# Patient Record
Sex: Male | Born: 1965 | Race: Black or African American | Hispanic: No | Marital: Married | Smoking: Never smoker
Health system: Southern US, Community
[De-identification: ages and names within clinical notes are randomized; demographics above are authoritative.]

## PROBLEM LIST (undated history)

## (undated) DIAGNOSIS — E669 Obesity, unspecified: Secondary | ICD-10-CM

## (undated) DIAGNOSIS — F419 Anxiety disorder, unspecified: Secondary | ICD-10-CM

## (undated) DIAGNOSIS — M545 Low back pain, unspecified: Secondary | ICD-10-CM

## (undated) DIAGNOSIS — R519 Headache, unspecified: Secondary | ICD-10-CM

## (undated) DIAGNOSIS — F4312 Post-traumatic stress disorder, chronic: Secondary | ICD-10-CM

## (undated) DIAGNOSIS — H9319 Tinnitus, unspecified ear: Secondary | ICD-10-CM

## (undated) DIAGNOSIS — J3089 Other allergic rhinitis: Secondary | ICD-10-CM

## (undated) DIAGNOSIS — M1992 Post-traumatic osteoarthritis, unspecified site: Secondary | ICD-10-CM

## (undated) DIAGNOSIS — N529 Male erectile dysfunction, unspecified: Secondary | ICD-10-CM

## (undated) HISTORY — DX: Other allergic rhinitis: J30.89

## (undated) HISTORY — DX: Headache, unspecified: R51.9

## (undated) HISTORY — DX: Obesity, unspecified: E66.9

## (undated) HISTORY — DX: Post-traumatic stress disorder, chronic: F43.12

## (undated) HISTORY — DX: Low back pain, unspecified: M54.50

## (undated) HISTORY — DX: Anxiety disorder, unspecified: F41.9

## (undated) HISTORY — DX: Male erectile dysfunction, unspecified: N52.9

## (undated) HISTORY — DX: Tinnitus, unspecified ear: H93.19

## (undated) HISTORY — PX: REVISION OF SCAR ON FACE/HEAD: SHX2349

## (undated) HISTORY — DX: Post-traumatic osteoarthritis, unspecified site: M19.92

---
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
## 2017-10-29 ENCOUNTER — Other Ambulatory Visit: Payer: Self-pay | Admitting: Otolaryngology

## 2017-10-29 DIAGNOSIS — H918X1 Other specified hearing loss, right ear: Secondary | ICD-10-CM

## 2017-10-29 DIAGNOSIS — IMO0001 Reserved for inherently not codable concepts without codable children: Secondary | ICD-10-CM

## 2017-11-11 ENCOUNTER — Other Ambulatory Visit: Payer: Self-pay

## 2017-11-28 ENCOUNTER — Ambulatory Visit
Admission: RE | Admit: 2017-11-28 | Discharge: 2017-11-28 | Disposition: A | Discharge status: Home / Self Care | Payer: 59 | Source: Ambulatory Visit | Attending: Otolaryngology | Admitting: Otolaryngology

## 2017-11-28 ENCOUNTER — Other Ambulatory Visit: Payer: Self-pay | Admitting: Otolaryngology

## 2017-11-28 DIAGNOSIS — IMO0001 Reserved for inherently not codable concepts without codable children: Secondary | ICD-10-CM

## 2017-11-28 DIAGNOSIS — H918X1 Other specified hearing loss, right ear: Secondary | ICD-10-CM
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
## 2020-01-24 ENCOUNTER — Ambulatory Visit (INDEPENDENT_AMBULATORY_CARE_PROVIDER_SITE_OTHER): Payer: No Typology Code available for payment source | Admitting: Neurology

## 2020-01-24 ENCOUNTER — Other Ambulatory Visit: Payer: Self-pay

## 2020-01-24 ENCOUNTER — Encounter: Payer: Self-pay | Admitting: Neurology

## 2020-01-24 ENCOUNTER — Encounter: Payer: Self-pay | Admitting: *Deleted

## 2020-01-24 VITALS — BP 162/77 | HR 52 | Ht 71.0 in | Wt 210.0 lb

## 2020-01-24 DIAGNOSIS — G43009 Migraine without aura, not intractable, without status migrainosus: Secondary | ICD-10-CM

## 2020-01-24 MED ORDER — RIZATRIPTAN BENZOATE 10 MG PO TBDP: 10.0000 mg | ORAL_TABLET | ORAL | 11 refills | Status: AC | PRN

## 2020-01-24 MED ORDER — ONDANSETRON 4 MG PO TBDP: 4.0000 mg | ORAL_TABLET | Freq: Three times a day (TID) | ORAL | 3 refills | Status: AC | PRN

## 2020-01-24 NOTE — Patient Instructions (Signed)
Rizatriptan: Please take one tablet at the onset of your headache. If it does not improve the symptoms please take one additional tablet. Do not take more then 2 tablets in 24hrs. Do not take use more then 2 to 3 times in a week. Can take this with Ondansetron and/or OTC Analgesics.  Ondansetron: for nausea as well   Migraine Headache A migraine headache is an intense, throbbing pain on one side or both sides of the head. Migraine headaches may also cause other symptoms, such as nausea, vomiting, and sensitivity to light and noise. A migraine headache can last from 4 hours to 3 days. Talk with your doctor about what things may bring on (trigger) your migraine headaches. What are the causes? The exact cause of this condition is not known. However, a migraine may be caused when nerves in the brain become irritated and release chemicals that cause inflammation of blood vessels. This inflammation causes pain. This condition may be triggered or caused by:  Drinking alcohol.  Smoking.  Taking medicines, such as: ? Medicine used to treat chest pain (nitroglycerin). ? Birth control pills. ? Estrogen. ? Certain blood pressure medicines.  Eating or drinking products that contain nitrates, glutamate, aspartame, or tyramine. Aged cheeses, chocolate, or caffeine may also be triggers.  Doing physical activity. Other things that may trigger a migraine headache include:  Menstruation.  Pregnancy.  Hunger.  Stress.  Lack of sleep or too much sleep.  Weather changes.  Fatigue. What increases the risk? The following factors may make you more likely to experience migraine headaches:  Being a certain age. This condition is more common in people who are 2-32 years old.  Being male.  Having a family history of migraine headaches.  Being Caucasian.  Having a mental health condition, such as depression or anxiety.  Being obese. What are the signs or symptoms? The main symptom of this  condition is pulsating or throbbing pain. This pain may:  Happen in any area of the head, such as on one side or both sides.  Interfere with daily activities.  Get worse with physical activity.  Get worse with exposure to bright lights or loud noises. Other symptoms may include:  Nausea.  Vomiting.  Dizziness.  General sensitivity to bright lights, loud noises, or smells. Before you get a migraine headache, you may get warning signs (an aura). An aura may include:  Seeing flashing lights or having blind spots.  Seeing bright spots, halos, or zigzag lines.  Having tunnel vision or blurred vision.  Having numbness or a tingling feeling.  Having trouble talking.  Having muscle weakness. Some people have symptoms after a migraine headache (postdromal phase), such as:  Feeling tired.  Difficulty concentrating. How is this diagnosed? A migraine headache can be diagnosed based on:  Your symptoms.  A physical exam.  Tests, such as: ? CT scan or an MRI of the head. These imaging tests can help rule out other causes of headaches. ? Taking fluid from the spine (lumbar puncture) and analyzing it (cerebrospinal fluid analysis, or CSF analysis). How is this treated? This condition may be treated with medicines that:  Relieve pain.  Relieve nausea.  Prevent migraine headaches. Treatment for this condition may also include:  Acupuncture.  Lifestyle changes like avoiding foods that trigger migraine headaches.  Biofeedback.  Cognitive behavioral therapy. Follow these instructions at home: Medicines  Take over-the-counter and prescription medicines only as told by your health care provider.  Ask your health care provider if  the medicine prescribed to you: ? Requires you to avoid driving or using heavy machinery. ? Can cause constipation. You may need to take these actions to prevent or treat constipation:  Drink enough fluid to keep your urine pale yellow.  Take  over-the-counter or prescription medicines.  Eat foods that are high in fiber, such as beans, whole grains, and fresh fruits and vegetables.  Limit foods that are high in fat and processed sugars, such as fried or sweet foods. Lifestyle  Do not drink alcohol.  Do not use any products that contain nicotine or tobacco, such as cigarettes, e-cigarettes, and chewing tobacco. If you need help quitting, ask your health care provider.  Get at least 8 hours of sleep every night.  Find ways to manage stress, such as meditation, deep breathing, or yoga. General instructions      Keep a journal to find out what may trigger your migraine headaches. For example, write down: ? What you eat and drink. ? How much sleep you get. ? Any change to your diet or medicines.  If you have a migraine headache: ? Avoid things that make your symptoms worse, such as bright lights. ? It may help to lie down in a dark, quiet room. ? Do not drive or use heavy machinery. ? Ask your health care provider what activities are safe for you while you are experiencing symptoms.  Keep all follow-up visits as told by your health care provider. This is important. Contact a health care provider if:  You develop symptoms that are different or more severe than your usual migraine headache symptoms.  You have more than 15 headache days in one month. Get help right away if:  Your migraine headache becomes severe.  Your migraine headache lasts longer than 72 hours.  You have a fever.  You have a stiff neck.  You have vision loss.  Your muscles feel weak or like you cannot control them.  You start to lose your balance often.  You have trouble walking.  You faint.  You have a seizure. Summary  A migraine headache is an intense, throbbing pain on one side or both sides of the head. Migraines may also cause other symptoms, such as nausea, vomiting, and sensitivity to light and noise.  This condition may be  treated with medicines and lifestyle changes. You may also need to avoid certain things that trigger a migraine headache.  Keep a journal to find out what may trigger your migraine headaches.  Contact your health care provider if you have more than 15 headache days in a month or you develop symptoms that are different or more severe than your usual migraine headache symptoms. This information is not intended to replace advice given to you by your health care provider. Make sure you discuss any questions you have with your health care provider. Document Revised: 09/18/2018 Document Reviewed: 07/09/2018 Elsevier Patient Education  2020 [employer]  Rizatriptan disintegrating tablets What is this medicine? RIZATRIPTAN (rye za TRIP tan) is used to treat migraines with or without aura. An aura is a strange feeling or visual disturbance that warns you of an attack. It is not used to prevent migraines. This medicine may be used for other purposes; ask your health care provider or pharmacist if you have questions. COMMON BRAND NAME(S): Maxalt-MLT What should I tell my health care provider before I take this medicine? They need to know if you have any of these conditions: cigarette smoker circulation problems in fingers  and toes diabetes heart disease high blood pressure high cholesterol history of irregular heartbeat history of stroke kidney disease liver disease stomach or intestine problems an unusual or allergic reaction to rizatriptan, other medicines, foods, dyes, or preservatives pregnant or trying to get pregnant breast-feeding How should I use this medicine? Take this medicine by mouth. Follow the directions on the prescription label. Leave the tablet in the sealed blister pack until you are ready to take it. With dry hands, open the blister and gently remove the tablet. If the tablet breaks or crumbles, throw it away and take a new tablet out of the blister pack. Place the tablet  in the mouth and allow it to dissolve, and then swallow. Do not cut, crush, or chew this medicine. You do not need water to take this medicine. Do not take it more often than directed. Talk to your pediatrician regarding the use of this medicine in children. While this drug may be prescribed for children as young as 6 years for selected conditions, precautions do apply. Overdosage: If you think you have taken too much of this medicine contact a poison control center or emergency room at once. NOTE: This medicine is only for you. Do not share this medicine with others. What if I miss a dose? This does not apply. This medicine is not for regular use. What may interact with this medicine? Do not take this medicine with any of the following medicines: certain medicines for migraine headache like almotriptan, eletriptan, frovatriptan, naratriptan, rizatriptan, sumatriptan, zolmitriptan ergot alkaloids like dihydroergotamine, ergonovine, ergotamine, methylergonovine MAOIs like Carbex, Eldepryl, Marplan, Nardil, and Parnate This medicine may also interact with the following medications: certain medicines for depression, anxiety, or psychotic disorders propranolol This list may not describe all possible interactions. Give your health care provider a list of all the medicines, herbs, non-prescription drugs, or dietary supplements you use. Also tell them if you smoke, drink alcohol, or use illegal drugs. Some items may interact with your medicine. What should I watch for while using this medicine? Visit your healthcare professional for regular checks on your progress. Tell your healthcare professional if your symptoms do not start to get better or if they get worse. You may get drowsy or dizzy. Do not drive, use machinery, or do anything that needs mental alertness until you know how this medicine affects you. Do not stand up or sit up quickly, especially if you are an older patient. This reduces the risk of  dizzy or fainting spells. Alcohol may interfere with the effect of this medicine. Your mouth may get dry. Chewing sugarless gum or sucking hard candy and drinking plenty of water may help. Contact your healthcare professional if the problem does not go away or is severe. If you take migraine medicines for 10 or more days a month, your migraines may get worse. Keep a diary of headache days and medicine use. Contact your healthcare professional if your migraine attacks occur more frequently. What side effects may I notice from receiving this medicine? Side effects that you should report to your doctor or health care professional as soon as possible: allergic reactions like skin rash, itching or hives, swelling of the face, lips, or tongue chest pain or chest tightness signs and symptoms of a dangerous change in heartbeat or heart rhythm like chest pain; dizziness; fast, irregular heartbeat; palpitations; feeling faint or lightheaded; falls; breathing problems signs and symptoms of a stroke like changes in vision; confusion; trouble speaking or understanding; severe headaches; sudden  numbness or weakness of the face, arm or leg; trouble walking; dizziness; loss of balance or coordination signs and symptoms of serotonin syndrome like irritable; confusion; diarrhea; fast or irregular heartbeat; muscle twitching; stiff muscles; trouble walking; sweating; high fever; seizures; chills; vomiting Side effects that usually do not require medical attention (report to your doctor or health care professional if they continue or are bothersome): diarrhea dizziness drowsiness dry mouth headache nausea, vomiting pain, tingling, numbness in the hands or feet stomach pain This list may not describe all possible side effects. Call your doctor for medical advice about side effects. You may report side effects to FDA at 1-800-FDA-1088. Where should I keep my medicine? Keep out of the reach of children. Store at room  temperature between 15 and 30 degrees C (59 and 86 degrees F). Protect from light and moisture. Throw away any unused medicine after the expiration date. NOTE: This sheet is a summary. It may not cover all possible information. If you have questions about this medicine, talk to your doctor, pharmacist, or health care provider.  2020 Elsevier/Gold Standard (2017-12-09 14:58:08)   Ondansetron oral dissolving tablet What is this medicine? ONDANSETRON (on DAN se tron) is used to treat nausea and vomiting caused by chemotherapy. It is also used to prevent or treat nausea and vomiting after surgery. This medicine may be used for other purposes; ask your health care provider or pharmacist if you have questions. COMMON BRAND NAME(S): Zofran ODT What should I tell my health care provider before I take this medicine? They need to know if you have any of these conditions: heart disease history of irregular heartbeat liver disease low levels of magnesium or potassium in the blood an unusual or allergic reaction to ondansetron, granisetron, other medicines, foods, dyes, or preservatives pregnant or trying to get pregnant breast-feeding How should I use this medicine? These tablets are made to dissolve in the mouth. Do not try to push the tablet through the foil backing. With dry hands, peel away the foil backing and gently remove the tablet. Place the tablet in the mouth and allow it to dissolve, then swallow. While you may take these tablets with water, it is not necessary to do so. Talk to your pediatrician regarding the use of this medicine in children. Special care may be needed. Overdosage: If you think you have taken too much of this medicine contact a poison control center or emergency room at once. NOTE: This medicine is only for you. Do not share this medicine with others. What if I miss a dose? If you miss a dose, take it as soon as you can. If it is almost time for your next dose, take only  that dose. Do not take double or extra doses. What may interact with this medicine? Do not take this medicine with any of the following medications: apomorphine certain medicines for fungal infections like fluconazole, itraconazole, ketoconazole, posaconazole, voriconazole cisapride dronedarone pimozide thioridazine This medicine may also interact with the following medications: carbamazepine certain medicines for depression, anxiety, or psychotic disturbances fentanyl linezolid MAOIs like Carbex, Eldepryl, Marplan, Nardil, and Parnate methylene blue (injected into a vein) other medicines that prolong the QT interval (cause an abnormal heart rhythm) like dofetilide, ziprasidone phenytoin rifampicin tramadol This list may not describe all possible interactions. Give your health care provider a list of all the medicines, herbs, non-prescription drugs, or dietary supplements you use. Also tell them if you smoke, drink alcohol, or use illegal drugs. Some items may interact  with your medicine. What should I watch for while using this medicine? Check with your doctor or health care professional as soon as you can if you have any sign of an allergic reaction. What side effects may I notice from receiving this medicine? Side effects that you should report to your doctor or health care professional as soon as possible: allergic reactions like skin rash, itching or hives, swelling of the face, lips, or tongue breathing problems confusion dizziness fast or irregular heartbeat feeling faint or lightheaded, falls fever and chills loss of balance or coordination seizures sweating swelling of the hands and feet tightness in the chest tremors unusually weak or tired Side effects that usually do not require medical attention (report to your doctor or health care professional if they continue or are bothersome): constipation or diarrhea headache This list may not describe all possible side  effects. Call your doctor for medical advice about side effects. You may report side effects to FDA at 1-800-FDA-1088. Where should I keep my medicine? Keep out of the reach of children. Store between 2 and 30 degrees C (36 and 86 degrees F). Throw away any unused medicine after the expiration date. NOTE: This sheet is a summary. It may not cover all possible information. If you have questions about this medicine, talk to your doctor, pharmacist, or health care provider.  2020 Elsevier/Gold Standard (2018-05-19 07:14:10)

## 2020-02-05 IMAGING — MR MR HEAD W/O CM
9 of 10 series · 42 of 48 positions shown · non-contrast
Comparison: None.

CLINICAL DATA: Right asymmetric hearing loss. Ringing in the right
ear for 2-3 months.

EXAM:
MRI HEAD WITHOUT CONTRAST
TECHNIQUE: Multiplanar, multiecho pulse sequences of the brain and surrounding
structures were obtained without intravenous contrast.

[Series 5: T1 · sagittal · 4.0mm · 0.72mm/px · 2 of 28 slices shown (1 of 3)]
[im 1/28]
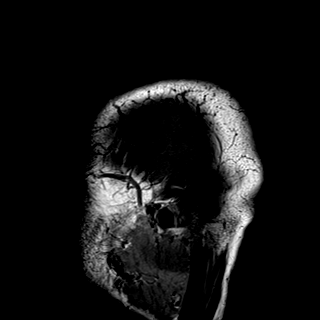
[im 28/28]
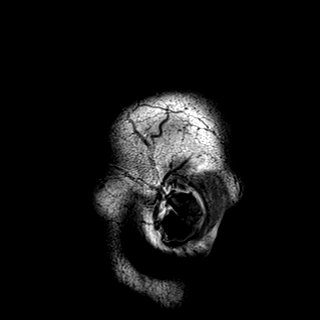

[Series 6: DWI · axial · 3.0mm · 1.44mm/px · z∈[-50,+117]mm · 8 of 88 slices shown (1 of 2)]
[im 1/88]
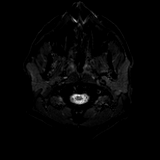
[im 13/88]
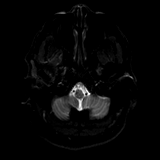
[im 25/88]
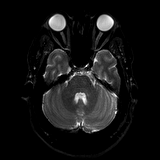
[im 38/88]
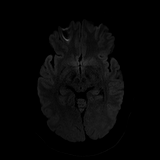
[im 50/88]
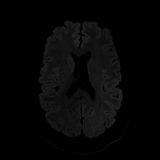
[im 63/88]
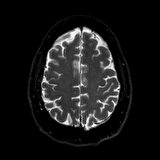
[im 75/88]
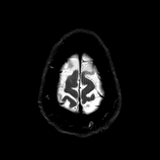
[im 88/88]
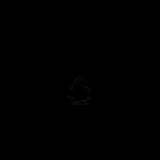

[Series 7: DWI · axial · 3.0mm · 1.44mm/px · z∈[-50,+117]mm · 4 of 44 slices shown (2 of 2)]
[im 1/44]
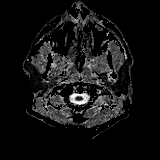
[im 15/44]
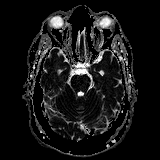
[im 29/44]
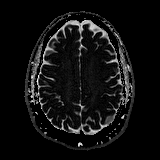
[im 44/44]
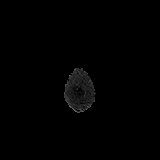

[Series 8: T2 · axial · 4.0mm · 0.36mm/px · z∈[-50,+117]mm · 3 of 33 slices shown]
[im 1/33]
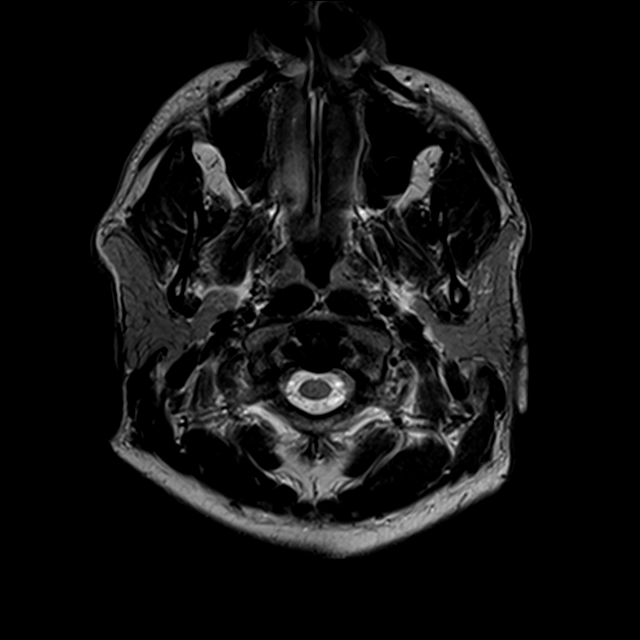
[im 17/33]
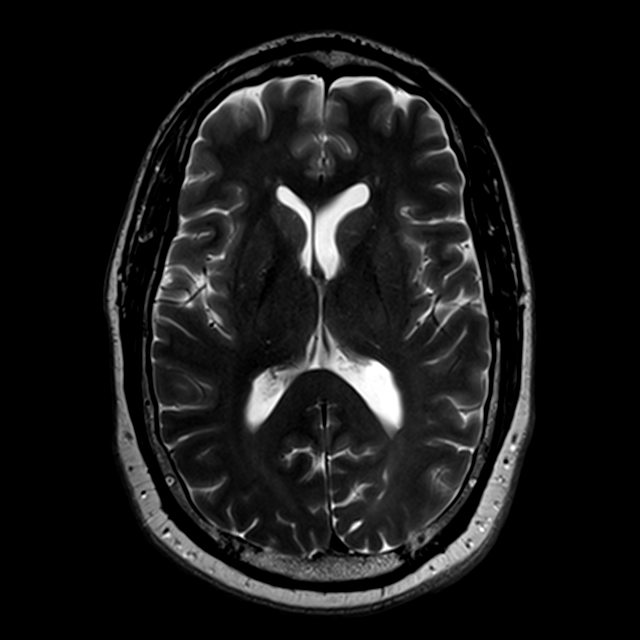
[im 33/33]
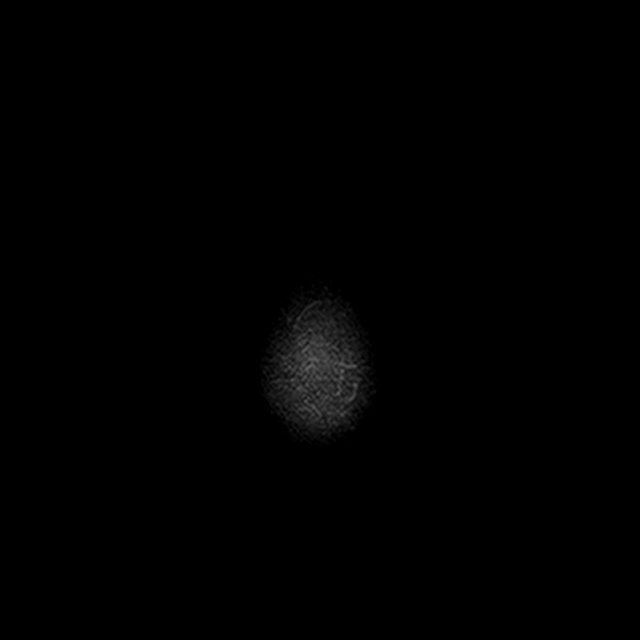

[Series 9: FLAIR · axial · 3.0mm · 0.72mm/px · z∈[-47,+114]mm · 3 of 28 slices shown]
[im 1/28]
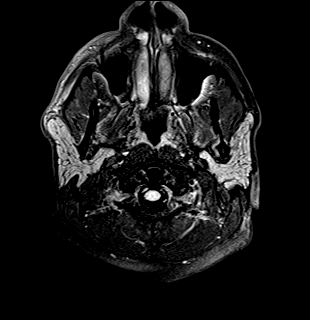
[im 14/28]
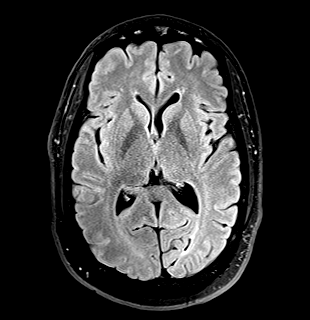
[im 28/28]
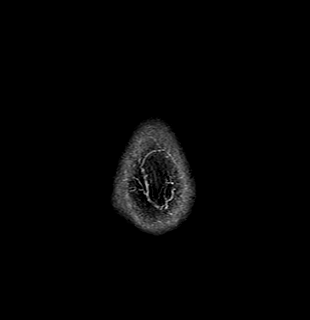

[Series 10: mip_images(sw) · axial · 12.0mm · 0.90mm/px · z∈[-44,+112]mm · 10 of 105 slices shown]
[im 1/105]
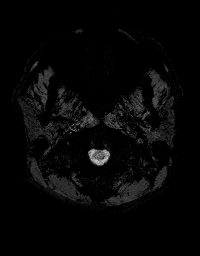
[im 12/105]
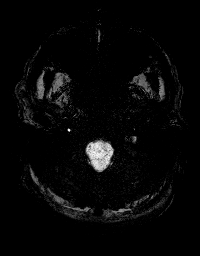
[im 24/105]
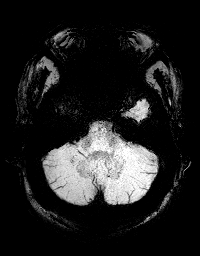
[im 35/105]
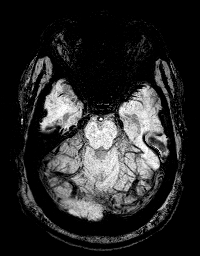
[im 47/105]
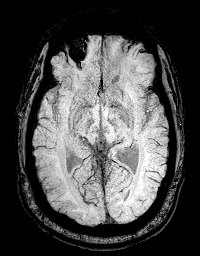
[im 58/105]
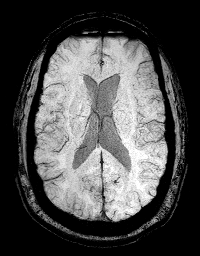
[im 70/105]
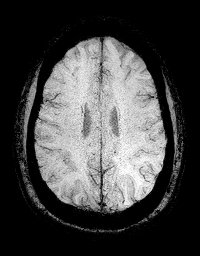
[im 81/105]
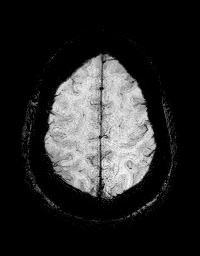
[im 93/105]
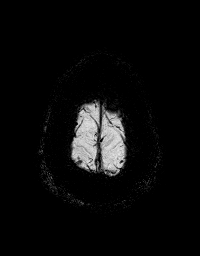
[im 105/105]
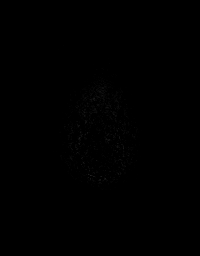

[Series 11: swi_images · axial · 1.5mm · 0.90mm/px · z∈[-49,+117]mm · 10 of 112 slices shown]
[im 1/112]
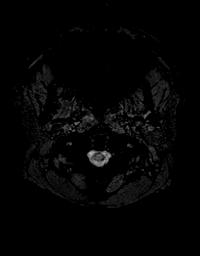
[im 13/112]
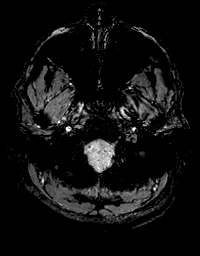
[im 25/112]
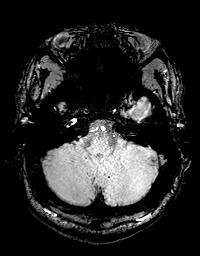
[im 38/112]
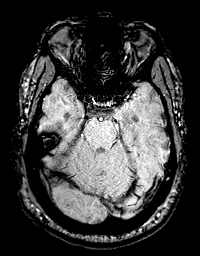
[im 50/112]
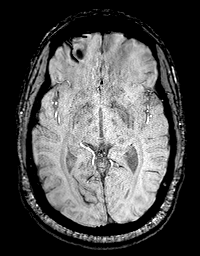
[im 62/112]
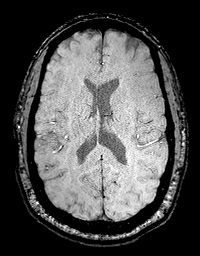
[im 75/112]
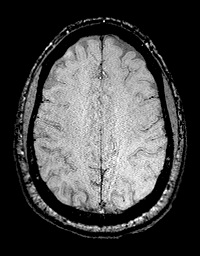
[im 87/112]
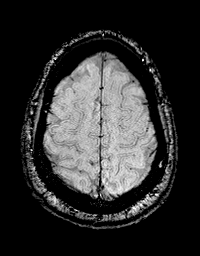
[im 99/112]
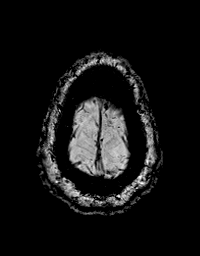
[im 112/112]
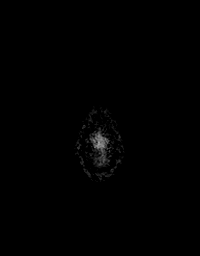

[Series 12: T1 · coronal · 2.5mm · 0.56mm/px · 1 of 13 slices shown (2 of 3)]
[im 1/13]
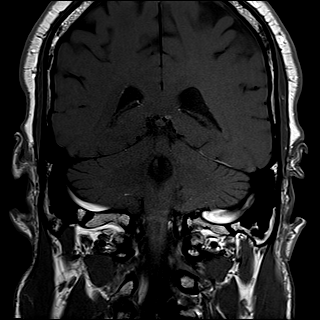

[Series 13: T1 · axial · 2.5mm · 0.50mm/px · 1 of 13 slices shown (3 of 3)]
[im 1/13]
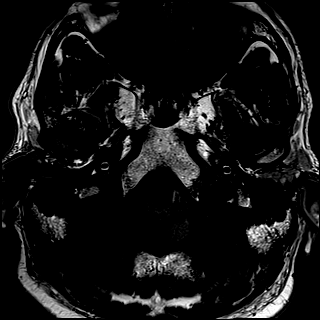

[42 of 48 positions shown; findings below may reference images not displayed]

FINDINGS: The patient became claustrophobic and terminated the examination
prematurely. IV contrast was not administered. Complete noncontrast
IAC protocol imaging was obtained.

Brain: There is no evidence of acute infarct, intracranial
hemorrhage, mass, midline shift, or extra-axial fluid collection.
Scattered punctate foci of T2/FLAIR hyperintensity in the bilateral
frontal lobe white matter are minimally greater than expected for
age. The ventricles and sulci are normal.

Noncontrast internal auditory canal imaging demonstrates a normal
course of the seventh and eighth cranial nerves without evidence of
a mass. The labyrinthine structures demonstrate normal signal
intensity bilaterally.

Vascular: Major intracranial vascular flow voids are preserved.

Skull and upper cervical spine: Unremarkable bone marrow signal.

Sinuses/Orbits: Unremarkable orbits. Paranasal sinuses and mastoid
air cells are clear.

Other: None.
IMPRESSION: 1. Unremarkable noncontrast internal auditory canal imaging. No
mass.
2. Scattered cerebral white matter T2 hyperintensities, nonspecific
though may reflect minimal chronic small vessel ischemia, migraines,
or prior infection/inflammation.

## 2023-01-03 ENCOUNTER — Other Ambulatory Visit (HOSPITAL_BASED_OUTPATIENT_CLINIC_OR_DEPARTMENT_OTHER): Payer: Self-pay | Admitting: Internal Medicine

## 2023-01-03 DIAGNOSIS — E7800 Pure hypercholesterolemia, unspecified: Secondary | ICD-10-CM

## 2023-01-06 ENCOUNTER — Telehealth (HOSPITAL_BASED_OUTPATIENT_CLINIC_OR_DEPARTMENT_OTHER): Payer: Self-pay

## 2023-01-20 ENCOUNTER — Ambulatory Visit (HOSPITAL_BASED_OUTPATIENT_CLINIC_OR_DEPARTMENT_OTHER)
Admission: RE | Admit: 2023-01-20 | Discharge: 2023-01-20 | Disposition: A | Discharge status: Home / Self Care | Payer: 59 | Source: Ambulatory Visit | Attending: Internal Medicine | Admitting: Internal Medicine

## 2023-01-20 DIAGNOSIS — E7800 Pure hypercholesterolemia, unspecified: Secondary | ICD-10-CM | POA: Insufficient documentation

## 2023-12-03 ENCOUNTER — Ambulatory Visit: Admitting: Physician Assistant
